# Patient Record
Sex: Male | Born: 1960 | Race: White | Hispanic: No | Marital: Married | Smoking: Never smoker
Health system: Southern US, Community
[De-identification: ages and names within clinical notes are randomized; demographics above are authoritative.]

## PROBLEM LIST (undated history)

## (undated) DIAGNOSIS — E119 Type 2 diabetes mellitus without complications: Secondary | ICD-10-CM

## (undated) HISTORY — PX: CERVICAL SPINE SURGERY: SHX589

---

## 2011-05-19 ENCOUNTER — Ambulatory Visit: Payer: Self-pay | Admitting: Family Medicine

## 2011-06-05 ENCOUNTER — Ambulatory Visit: Payer: Self-pay | Admitting: Family Medicine

## 2011-07-05 ENCOUNTER — Ambulatory Visit: Payer: Self-pay | Admitting: Family Medicine

## 2014-08-07 ENCOUNTER — Emergency Department
Admission: EM | Admit: 2014-08-07 | Discharge: 2014-08-08 | Disposition: A | Discharge status: Home / Self Care | Payer: BLUE CROSS/BLUE SHIELD | Attending: Emergency Medicine | Admitting: Emergency Medicine

## 2014-08-07 ENCOUNTER — Emergency Department: Payer: BLUE CROSS/BLUE SHIELD

## 2014-08-07 ENCOUNTER — Other Ambulatory Visit: Payer: Self-pay

## 2014-08-07 ENCOUNTER — Encounter: Payer: Self-pay | Admitting: Emergency Medicine

## 2014-08-07 DIAGNOSIS — E119 Type 2 diabetes mellitus without complications: Secondary | ICD-10-CM | POA: Insufficient documentation

## 2014-08-07 DIAGNOSIS — G8918 Other acute postprocedural pain: Secondary | ICD-10-CM | POA: Diagnosis not present

## 2014-08-07 DIAGNOSIS — R509 Fever, unspecified: Secondary | ICD-10-CM | POA: Diagnosis not present

## 2014-08-07 DIAGNOSIS — M542 Cervicalgia: Secondary | ICD-10-CM | POA: Diagnosis not present

## 2014-08-07 DIAGNOSIS — M25512 Pain in left shoulder: Secondary | ICD-10-CM

## 2014-08-07 DIAGNOSIS — Z79899 Other long term (current) drug therapy: Secondary | ICD-10-CM | POA: Diagnosis not present

## 2014-08-07 HISTORY — DX: Type 2 diabetes mellitus without complications: E11.9

## 2014-08-07 LAB — CBC WITH DIFFERENTIAL/PLATELET
BASOS PCT: 0 %
Basophils Absolute: 0 10*3/uL (ref 0–0.1)
Eosinophils Absolute: 0.1 10*3/uL (ref 0–0.7)
Eosinophils Relative: 1 %
HEMATOCRIT: 39.1 % — AB (ref 40.0–52.0)
Hemoglobin: 12.6 g/dL — ABNORMAL LOW (ref 13.0–18.0)
LYMPHS ABS: 3.2 10*3/uL (ref 1.0–3.6)
Lymphocytes Relative: 28 %
MCH: 27.3 pg (ref 26.0–34.0)
MCHC: 32.3 g/dL (ref 32.0–36.0)
MCV: 84.7 fL (ref 80.0–100.0)
MONO ABS: 1 10*3/uL (ref 0.2–1.0)
Monocytes Relative: 9 %
NEUTROS PCT: 62 %
Neutro Abs: 6.9 10*3/uL — ABNORMAL HIGH (ref 1.4–6.5)
PLATELETS: 200 10*3/uL (ref 150–440)
RBC: 4.61 MIL/uL (ref 4.40–5.90)
RDW: 14.1 % (ref 11.5–14.5)
WBC: 11.2 10*3/uL — ABNORMAL HIGH (ref 3.8–10.6)

## 2014-08-07 LAB — COMPREHENSIVE METABOLIC PANEL
ALT: 15 U/L — ABNORMAL LOW (ref 17–63)
AST: 19 U/L (ref 15–41)
Albumin: 3.8 g/dL (ref 3.5–5.0)
Alkaline Phosphatase: 59 U/L (ref 38–126)
Anion gap: 8 (ref 5–15)
BUN: 19 mg/dL (ref 6–20)
CO2: 23 mmol/L (ref 22–32)
Calcium: 9.3 mg/dL (ref 8.9–10.3)
Chloride: 105 mmol/L (ref 101–111)
Creatinine, Ser: 1.03 mg/dL (ref 0.61–1.24)
GFR calc Af Amer: >60 mL/min (ref >60)
Glucose, Bld: 192 mg/dL — ABNORMAL HIGH (ref 65–99)
Potassium: 4.4 mmol/L (ref 3.5–5.1)
Sodium: 136 mmol/L (ref 135–145)
Total Bilirubin: 0.7 mg/dL (ref 0.3–1.2)
Total Protein: 7.1 g/dL (ref 6.5–8.1)

## 2014-08-07 MED ORDER — IOHEXOL 300 MG/ML  SOLN
75.0000 mL | Freq: Once | INTRAMUSCULAR | Status: AC | PRN
Start: 2014-08-07 — End: 2014-08-07
  Administered 2014-08-07: 75 mL via INTRAVENOUS

## 2014-08-07 NOTE — ED Notes (Signed)
Patient had surgery c6 and c7 fusion Wednesday afternoon. Wednesday night patient had to be taken back to emergency surgery for a hematoma. Tonight patient developed a shooting pain to left shoulder and arm. Patient started running a fever of 101 at home.

## 2014-08-08 ENCOUNTER — Emergency Department: Payer: BLUE CROSS/BLUE SHIELD

## 2014-08-08 MED ORDER — HYDROMORPHONE HCL 1 MG/ML IJ SOLN
1.0000 mg | Freq: Once | INTRAMUSCULAR | Status: AC
  Administered 2014-08-08: 1 mg via INTRAVENOUS

## 2014-08-08 MED ORDER — SENNA 8.6 MG PO TABS: 2.0000 | ORAL_TABLET | Freq: Two times a day (BID) | ORAL | Status: AC

## 2014-08-08 MED ORDER — HYDROMORPHONE HCL 1 MG/ML IJ SOLN
INTRAMUSCULAR | Status: AC
  Filled 2014-08-08: qty 1

## 2014-08-08 MED ORDER — SODIUM CHLORIDE 0.9 % IV BOLUS (SEPSIS)
1000.0000 mL | Freq: Once | INTRAVENOUS | Status: AC
  Administered 2014-08-08: 1000 mL via INTRAVENOUS

## 2014-08-08 MED ORDER — DOCUSATE SODIUM 100 MG PO CAPS: 200.0000 mg | ORAL_CAPSULE | Freq: Two times a day (BID) | ORAL | Status: AC

## 2014-08-08 NOTE — ED Provider Notes (Addendum)
[facility] Emergency Department Provider Note  ____________________________________________  Time seen: 11:05 PM  I have reviewed the triage vital signs and the nursing notes.   HISTORY  Chief Complaint Post-op Problem    HPI Darin James is a 54 y.o. male who complains of pain to the lower neck as well as an episode of severe shooting pain in the left arm associated with left arm weakness earlier today. He has that his strength now feels like it's returned and the pain has improved although he does have severe pain with movement particularly abduction of the left shoulder. 2 days ago he had a C6-C7 fusion which was complicated by hematoma or pseudoaneurysm formation that required emergent reoperation the same day. Today at home he had a fever of 101. No vomiting or diarrhea, no shortness of breath or cough, no dysuria or frequency or urgency.No bowel or bladder retention or incontinence. Normal gait. No distal paresthesia other than the left arm     Past Medical History  Diagnosis Date  . Diabetes mellitus without complication     There are no active problems to display for this patient.   No past surgical history on file. Cervical fusion 2 days ago Current Outpatient Rx  Name  Route  Sig  Dispense  Refill  . docusate sodium (COLACE) 100 MG capsule   Oral   Take 2 capsules (200 mg total) by mouth 2 (two) times daily.   120 capsule   0   . senna (SENOKOT) 8.6 MG TABS tablet   Oral   Take 2 tablets (17.2 mg total) by mouth 2 (two) times daily.   120 each   0    OxyContin Allergies Review of patient's allergies indicates no known allergies.  History reviewed. No pertinent family history.  Social History History  Substance Use Topics  . Smoking status: Never Smoker   . Smokeless tobacco: Not on file  . Alcohol Use: No     Comment: occasionaly    Review of Systems  Constitutional: Fever and chills. No weight changes Eyes:No  blurry vision or double vision.  ENT: No sore throat. Cardiovascular: No chest pain. Respiratory: No dyspnea or cough. Gastrointestinal: Negative for abdominal pain, vomiting and diarrhea.  No BRBPR or melena. Genitourinary: Negative for dysuria, urinary retention, bloody urine, or difficulty urinating. Musculoskeletal: Left arm pain and neck pain.. Skin: Negative for rash. Neurological: Negative for headaches, focal weakness or numbness. Psychiatric:No anxiety or depression.   Endocrine:No hot/cold intolerance, changes in energy, or sleep difficulty.  10-point ROS otherwise negative.  ____________________________________________   PHYSICAL EXAM:  VITAL SIGNS: ED Triage Vitals  Enc Vitals Group     BP 08/07/14 2201 154/97 mmHg     Pulse Rate 08/07/14 2300 85     Resp 08/07/14 2201 18     Temp 08/07/14 2201 99.3 F (37.4 C)     Temp Source 08/07/14 2201 Oral     SpO2 08/07/14 2201 93 %     Weight 08/07/14 2201 235 lb (106.595 kg)     Height 08/07/14 2201  (1.854 m)     Head Cir --      Peak Flow --      Pain Score 08/07/14 2202 8     Pain Loc --      Pain Edu? --      Excl. in GC? --      Constitutional: Alert and oriented. Well appearing and in no distress. Eyes: No scleral icterus.  No conjunctival pallor. PERRL. EOMI ENT   Head: Normocephalic and atraumatic.   Nose: No congestion/rhinnorhea. No septal hematoma   Mouth/Throat: MMM, no pharyngeal erythema. No peritonsillar mass. No uvula shift.   Neck: No stridor. No SubQ emphysema. No meningismus. Anterior approach incisions healing well without drainage induration swelling or significant erythema. Nontender. No pain with axial loading. No midline cervical or spinal tenderness. Hematological/Lymphatic/Immunilogical: No cervical lymphadenopathy. Cardiovascular: RRR. Normal and symmetric distal pulses are present in all extremities. No murmurs, rubs, or gallops. Respiratory: Normal respiratory effort  without tachypnea nor retractions. Breath sounds are clear and equal bilaterally. No wheezes/rales/rhonchi. Gastrointestinal: Soft and nontender. No distention. There is no CVA tenderness.  No rebound, rigidity, or guarding. Genitourinary: deferred Musculoskeletal: Left arm pain with abduction of the left shoulder. No focal tenderness. No rash or crepitus or evidence of localized inflammation.  Neurologic:   Normal speech and language.  CN 2-10 normal. Motor grossly intact. No pronator drift.  Normal gait. No gross focal neurologic deficits are appreciated.  Skin:  Skin is warm, dry and intact. No rash noted.  No petechiae, purpura, or bullae. Psychiatric: Mood and affect are normal. Speech and behavior are normal. Patient exhibits appropriate insight and judgment.  ____________________________________________    LABS (pertinent positives/negatives) (all labs ordered are listed, but only abnormal results are displayed) Labs Reviewed  CBC WITH DIFFERENTIAL/PLATELET - Abnormal; Notable for the following:    WBC 11.2 (*)    Hemoglobin 12.6 (*)    HCT 39.1 (*)    Neutro Abs 6.9 (*)    All other components within normal limits  COMPREHENSIVE METABOLIC PANEL - Abnormal; Notable for the following:    Glucose, Bld 192 (*)    ALT 15 (*)    All other components within normal limits  URINALYSIS COMPLETEWITH MICROSCOPIC ([facility] ONLY)   ____________________________________________   EKG interpreted by me Normal sinus rhythm rate of 99, normal axis intervals QRS ST segments and T waves.  ____________________________________________    RADIOLOGY  Chest x-ray shows bibasilar atelectasis versus mild infiltrate CT soft tissue neck unremarkable MRI cervical spine unremarkable  ____________________________________________   PROCEDURES  ____________________________________________   INITIAL IMPRESSION / ASSESSMENT AND PLAN / ED COURSE  Pertinent labs & imaging results that were  available during my care of the patient were reviewed by me and considered in my medical decision making (see chart for details).  Patient's overall presentation does not appear to be strongly consistent with spinal epidural abscess or hematoma formation. Due to the lack of other symptoms, his fever may be due to atelectasis, and the left arm pain does sound consistent with a typical post operative spasticity and musculoskeletal irritation. However, he is at elevated risk for spinal complication. We'll start with chest x-ray and CT of the neck soft tissues to look for obvious cause, and if unremarkable we'll proceed with MRI of the C-spine.  ----------------------------------------- 2:08 AM on 08/08/2014 -----------------------------------------  No convincing cause of his symptoms have been found with initial workup. Patient proceeding to MRI at this time. Remains hemodynamically stable, pain controlled, we'll follow-up MRI report ----------------------------------------- 4:00 AM on 08/08/2014 -----------------------------------------  Patient remains hemodynamically stable, calm comfortable. We'll start on senna Colace GI regimen due to his frequent opioid medication use. No evidence of epidural abscess or hematoma or other complication at this time. We'll have him follow-up with his neurosurgeon for continued monitoring of his symptoms. Fever appears to be due to atelectasis which is a expected postoperative issue. No evidence of  pneumonia or symptoms consistent of urinary tract infection or other source of infection or sepsis. ____________________________________________   FINAL CLINICAL IMPRESSION(S) / ED DIAGNOSES  Final diagnoses:  Neck pain  Left shoulder pain      Sharman CheekPhillip Day Greb, MD 08/08/14 0401  Sharman CheekPhillip Magnus Crescenzo, MD 08/08/14 [phone]

## 2014-08-08 NOTE — Discharge Instructions (Signed)

## 2014-08-08 NOTE — ED Notes (Signed)
Pt up for discharge.  Pt left before discharge instructions could be reviewed.  Pt prescribed two prescriptions.  Called pt at [phone] with answer and informed pt that discharge packet and prescriptions will be held at front desk.

## 2016-06-01 DIAGNOSIS — Z794 Long term (current) use of insulin: Secondary | ICD-10-CM | POA: Diagnosis not present

## 2016-06-01 DIAGNOSIS — I10 Essential (primary) hypertension: Secondary | ICD-10-CM | POA: Diagnosis not present

## 2016-06-01 DIAGNOSIS — E119 Type 2 diabetes mellitus without complications: Secondary | ICD-10-CM | POA: Diagnosis not present

## 2016-06-01 DIAGNOSIS — Z23 Encounter for immunization: Secondary | ICD-10-CM | POA: Diagnosis not present

## 2016-06-01 DIAGNOSIS — E1140 Type 2 diabetes mellitus with diabetic neuropathy, unspecified: Secondary | ICD-10-CM | POA: Diagnosis not present

## 2016-06-01 DIAGNOSIS — Z0000 Encounter for general adult medical examination without abnormal findings: Secondary | ICD-10-CM | POA: Diagnosis not present

## 2016-06-06 DIAGNOSIS — E875 Hyperkalemia: Secondary | ICD-10-CM | POA: Diagnosis not present

## 2016-06-17 DIAGNOSIS — J019 Acute sinusitis, unspecified: Secondary | ICD-10-CM | POA: Diagnosis not present

## 2016-09-01 DIAGNOSIS — M5030 Other cervical disc degeneration, unspecified cervical region: Secondary | ICD-10-CM | POA: Diagnosis not present

## 2016-09-01 DIAGNOSIS — E1121 Type 2 diabetes mellitus with diabetic nephropathy: Secondary | ICD-10-CM | POA: Diagnosis not present

## 2016-09-01 DIAGNOSIS — M4692 Unspecified inflammatory spondylopathy, cervical region: Secondary | ICD-10-CM | POA: Diagnosis not present

## 2016-09-01 DIAGNOSIS — E119 Type 2 diabetes mellitus without complications: Secondary | ICD-10-CM | POA: Diagnosis not present

## 2016-09-04 DIAGNOSIS — R079 Chest pain, unspecified: Secondary | ICD-10-CM | POA: Diagnosis not present

## 2016-09-04 DIAGNOSIS — E1121 Type 2 diabetes mellitus with diabetic nephropathy: Secondary | ICD-10-CM | POA: Diagnosis not present

## 2016-09-04 DIAGNOSIS — M4692 Unspecified inflammatory spondylopathy, cervical region: Secondary | ICD-10-CM | POA: Diagnosis not present

## 2016-09-04 DIAGNOSIS — M5030 Other cervical disc degeneration, unspecified cervical region: Secondary | ICD-10-CM | POA: Diagnosis not present

## 2016-09-14 DIAGNOSIS — R079 Chest pain, unspecified: Secondary | ICD-10-CM | POA: Diagnosis not present

## 2016-09-14 DIAGNOSIS — M5030 Other cervical disc degeneration, unspecified cervical region: Secondary | ICD-10-CM | POA: Diagnosis not present

## 2016-09-14 DIAGNOSIS — E1121 Type 2 diabetes mellitus with diabetic nephropathy: Secondary | ICD-10-CM | POA: Diagnosis not present

## 2016-09-14 DIAGNOSIS — M4692 Unspecified inflammatory spondylopathy, cervical region: Secondary | ICD-10-CM | POA: Diagnosis not present

## 2016-12-18 DIAGNOSIS — Z2821 Immunization not carried out because of patient refusal: Secondary | ICD-10-CM | POA: Diagnosis not present

## 2016-12-18 DIAGNOSIS — I10 Essential (primary) hypertension: Secondary | ICD-10-CM | POA: Diagnosis not present

## 2016-12-18 DIAGNOSIS — M5030 Other cervical disc degeneration, unspecified cervical region: Secondary | ICD-10-CM | POA: Diagnosis not present

## 2016-12-18 DIAGNOSIS — E119 Type 2 diabetes mellitus without complications: Secondary | ICD-10-CM | POA: Diagnosis not present

## 2016-12-24 ENCOUNTER — Emergency Department: Payer: BLUE CROSS/BLUE SHIELD

## 2016-12-24 ENCOUNTER — Emergency Department
Admission: EM | Admit: 2016-12-24 | Discharge: 2016-12-24 | Disposition: A | Discharge status: Home / Self Care | Payer: BLUE CROSS/BLUE SHIELD | Attending: Emergency Medicine | Admitting: Emergency Medicine

## 2016-12-24 ENCOUNTER — Encounter: Payer: Self-pay | Admitting: Emergency Medicine

## 2016-12-24 DIAGNOSIS — N23 Unspecified renal colic: Secondary | ICD-10-CM | POA: Diagnosis not present

## 2016-12-24 DIAGNOSIS — R109 Unspecified abdominal pain: Secondary | ICD-10-CM | POA: Diagnosis not present

## 2016-12-24 DIAGNOSIS — R1031 Right lower quadrant pain: Secondary | ICD-10-CM | POA: Diagnosis present

## 2016-12-24 DIAGNOSIS — E11649 Type 2 diabetes mellitus with hypoglycemia without coma: Secondary | ICD-10-CM | POA: Insufficient documentation

## 2016-12-24 DIAGNOSIS — N50811 Right testicular pain: Secondary | ICD-10-CM

## 2016-12-24 DIAGNOSIS — N433 Hydrocele, unspecified: Secondary | ICD-10-CM | POA: Diagnosis not present

## 2016-12-24 DIAGNOSIS — N281 Cyst of kidney, acquired: Secondary | ICD-10-CM | POA: Diagnosis not present

## 2016-12-24 DIAGNOSIS — N200 Calculus of kidney: Secondary | ICD-10-CM | POA: Diagnosis not present

## 2016-12-24 DIAGNOSIS — E162 Hypoglycemia, unspecified: Secondary | ICD-10-CM

## 2016-12-24 LAB — URINALYSIS, COMPLETE (UACMP) WITH MICROSCOPIC
Bacteria, UA: NONE SEEN
Bilirubin Urine: NEGATIVE
Glucose, UA: NEGATIVE mg/dL
Hgb urine dipstick: NEGATIVE
Ketones, ur: NEGATIVE mg/dL
Leukocytes, UA: NEGATIVE
Nitrite: NEGATIVE
PROTEIN: NEGATIVE mg/dL
SPECIFIC GRAVITY, URINE: 1.019 (ref 1.005–1.030)
Squamous Epithelial / LPF: NONE SEEN
pH: 5 (ref 5.0–8.0)

## 2016-12-24 LAB — BASIC METABOLIC PANEL
Anion gap: 8 (ref 5–15)
BUN: 16 mg/dL (ref 6–20)
CALCIUM: 9.5 mg/dL (ref 8.9–10.3)
CHLORIDE: 107 mmol/L (ref 101–111)
CO2: 23 mmol/L (ref 22–32)
Creatinine, Ser: 1.06 mg/dL (ref 0.61–1.24)
GFR calc non Af Amer: >60 mL/min (ref >60)
Glucose, Bld: 64 mg/dL — ABNORMAL LOW (ref 65–99)
POTASSIUM: 4.6 mmol/L (ref 3.5–5.1)
Sodium: 138 mmol/L (ref 135–145)

## 2016-12-24 LAB — CBC
HEMATOCRIT: 44 % (ref 40.0–52.0)
HEMOGLOBIN: 14.2 g/dL (ref 13.0–18.0)
MCH: 28.2 pg (ref 26.0–34.0)
MCHC: 32.4 g/dL (ref 32.0–36.0)
MCV: 87 fL (ref 80.0–100.0)
Platelets: 202 10*3/uL (ref 150–440)
RBC: 5.05 MIL/uL (ref 4.40–5.90)
RDW: 15.1 % — AB (ref 11.5–14.5)
WBC: 7.9 10*3/uL (ref 3.8–10.6)

## 2016-12-24 MED ORDER — KETOROLAC TROMETHAMINE 60 MG/2ML IM SOLN
60.0000 mg | Freq: Once | INTRAMUSCULAR | Status: AC
  Administered 2016-12-24: 60 mg via INTRAMUSCULAR

## 2016-12-24 MED ORDER — ONDANSETRON 4 MG PO TBDP: 4.0000 mg | ORAL_TABLET | Freq: Three times a day (TID) | ORAL | 0 refills | Status: AC | PRN

## 2016-12-24 MED ORDER — KETOROLAC TROMETHAMINE 60 MG/2ML IM SOLN
INTRAMUSCULAR | Status: AC
  Administered 2016-12-24: 60 mg via INTRAMUSCULAR
  Filled 2016-12-24: qty 2

## 2016-12-24 MED ORDER — KETOROLAC TROMETHAMINE 10 MG PO TABS: 10.0000 mg | ORAL_TABLET | Freq: Three times a day (TID) | ORAL | 0 refills | Status: AC | PRN

## 2016-12-24 NOTE — ED Provider Notes (Signed)
[facility] Emergency Department Provider Note  ____________________________________________  Time seen: Approximately 4:15 PM  I have reviewed the triage vital signs and the nursing notes.   HISTORY  Chief Complaint Flank Pain    HPI Darin James is a 56 y.o. male with a history of renal colic, DM, presenting with lower right back pain radiating to the right groin crease and into the right testiclefor the past 3 days. The patient denies any dysuria, hematuria, urinary frequency. He has not had any fever or chills, nausea vomiting or diarrhea. Trauma or strain. No history of back pain.   Past Medical History:  Diagnosis Date  . Diabetes mellitus without complication (HCC)     There are no active problems to display for this patient.   Past Surgical History:  Procedure Laterality Date  . CERVICAL SPINE SURGERY      Current Outpatient Rx  . Order #: [id number] Class: Print  . Order #: [id number] Class: Print  . Order #: [id number] Class: Print  . Order #: [id number] Class: Print    Allergies Patient has no known allergies.  No family history on file.  Social History Social History  Substance Use Topics  . Smoking status: Never Smoker  . Smokeless tobacco: Never Used  . Alcohol use No     Comment: occasionaly    Review of Systems Constitutional: No fever/chills. No lightheadedness or syncope. Eyes: No visual changes. ENT: No sore throat. No congestion or rhinorrhea. Cardiovascular: Denies chest pain. Denies palpitations. Respiratory: Denies shortness of breath.  No cough. Gastrointestinal: No abdominal pain.  No nausea, no vomiting.  No diarrhea.  No constipation. Genitourinary: Negative for dysuria. No hematuria. No urinary frequency. No penile discharge. Positive right testicular pain. Musculoskeletal: Positive for pain over the right buttock, radiating to the right inguinal crease. Skin: Negative for rash. Neurological: Negative for  headaches. No focal numbness, tingling or weakness.     ____________________________________________   PHYSICAL EXAM:  VITAL SIGNS: ED Triage Vitals  Enc Vitals Group     BP 12/24/16 1357 (!) 145/101     Pulse Rate 12/24/16 1357 64     Resp 12/24/16 1357 16     Temp 12/24/16 1357 97.6 F (36.4 C)     Temp Source 12/24/16 1357 Oral     SpO2 12/24/16 1357 99 %     Weight 12/24/16 1357 240 lb (108.9 kg)     Height 12/24/16 1357 6\' 1"  (1.854 m)     Head Circumference --      Peak Flow --      Pain Score 12/24/16 1356 7     Pain Loc --      Pain Edu? --      Excl. in GC? --     Constitutional: Alert and oriented. Well appearing and in no acute distress. Answers questions appropriately. Eyes: Conjunctivae are normal.  EOMI. No scleral icterus. Head: Atraumatic. Nose: No congestion/rhinnorhea. Mouth/Throat: Mucous membranes are moist.  Neck: No stridor.  Supple.   Cardiovascular: Normal rate, regular rhythm. No murmurs, rubs or gallops.  Respiratory: Normal respiratory effort.  No accessory muscle use or retractions. Lungs CTAB.  No wheezes, rales or ronchi. Gastrointestinal: Soft, nontender and nondistended.  No guarding or rebound.  No peritoneal signs. Genitourinary: Normal-appearing penis without lesions or discharge. Right testicle has a slightly lower lie than the left. Both testicles are without palpable masses, or tenderness to palpation. There is some right inguinal fullness but no palpable hernia on inguinal exam. The left  inguinal canal is also clear of palpable bowel. Musculoskeletal: No LE edema. No midline thoracic or lumbar spine tenderness to palpation. No reproducible pain over the right buttock. Neurologic:  A&Ox3.  Speech is clear.  Face and smile are symmetric.  EOMI.  Moves all extremities well. Skin:  Skin is warm, dry and intact. No rash noted. Psychiatric: Mood and affect are normal. Speech and behavior are normal.  Normal  judgement.  ____________________________________________   LABS (all labs ordered are listed, but only abnormal results are displayed)  Labs Reviewed  URINALYSIS, COMPLETE (UACMP) WITH MICROSCOPIC - Abnormal; Notable for the following:       Result Value   Color, Urine YELLOW (*)    APPearance CLEAR (*)    All other components within normal limits  CBC - Abnormal; Notable for the following:    RDW 15.1 (*)    All other components within normal limits  BASIC METABOLIC PANEL - Abnormal; Notable for the following:    Glucose, Bld 64 (*)    All other components within normal limits   ____________________________________________  EKG  Not indicated ____________________________________________  RADIOLOGY  Ct Renal Stone Study  Result Date: 12/24/2016 CLINICAL DATA:  Right flank pain since Friday radiating to the testicles. EXAM: CT ABDOMEN AND PELVIS WITHOUT CONTRAST TECHNIQUE: Multidetector CT imaging of the abdomen and pelvis was performed following the standard protocol without IV contrast. COMPARISON:  None. FINDINGS: Lower chest: No acute abnormality. Normal heart size without pericardial effusion. No acute pulmonary abnormality. Hepatobiliary: Uncomplicated cholelithiasis. No space-occupying mass of the liver given limitations of a noncontrast study. No biliary dilatation is noted. There is no choledocholithiasis. Pancreas: Unremarkable. No pancreatic ductal dilatation or surrounding inflammatory changes. Spleen: Normal in size without focal abnormality. Adrenals/Urinary Tract: Normal bilateral adrenal glands. No nephrolithiasis obstructive uropathy. Small extrarenal pelvis on the left. There is a 2 cm interpolar right renal simple cyst with small 1 cm exophytic left lower pole renal cyst too small to further characterize. Stomach/Bowel: Nondistended stomach. Normal small bowel rotation without obstruction. The appendix is not confidently identified but no pericecal inflammation to  suggest acute appendicitis. A moderate amount of fecal retention noted. No bowel obstruction is seen. Vascular/Lymphatic: Small retroperitoneal para-aortic lymph nodes without pathologic enlargement, the largest approximately 12 mm. No lymphadenopathy. Minimal aortic atherosclerosis without aneurysm. Reproductive: Normal prostate and seminal vesicles. Other: Faint fatty induration adjacent to the left ureter without obstruction, overlying the left iliac vessels. Findings could be related to a urinary tract infection or recently passed stone. Musculoskeletal: No acute nor suspicious osseous abnormality. Lumbosacral transitional vertebral anatomy with short twelfth ribs. The lowest square vertebral body on the lateral view to be labeled L5. IMPRESSION: 1. Mild left-sided periuterine enteric fatty induration without obstruction. Findings are nonspecific but may be secondary to urinary tract infection or recently passed stone. 2. No nephrolithiasis. Bilateral renal cysts, the largest measuring 2 cm in the interpolar right kidney and a 1 cm exophytic cyst on the left. 3. Uncomplicated cholelithiasis. 4. Several nonspecific small retroperitoneal, para-aortic lymph nodes possibly representing reactive lymph nodes or potentially a lymphoproliferative disorder. The largest measures 12 mm. Electronically Signed   By: Tollie Eth M.D.   On: 12/24/2016 18:02   [place] Scrotom W/doppler  Result Date: 12/24/2016 CLINICAL DATA:  RIGHT testicle pain for 2 days. History of diabetes. EXAM: SCROTAL ULTRASOUND DOPPLER ULTRASOUND OF THE TESTICLES TECHNIQUE: Complete ultrasound examination of the testicles, epididymis, and other scrotal structures was performed. Color and spectral Doppler ultrasound were  also utilized to evaluate blood flow to the testicles. COMPARISON:  None. FINDINGS: Right testicle Measurements: 4.5 x 2.4 x 3.1 cm. No mass or microlithiasis visualized. Left testicle Measurements: 4.2 x 2.2 x 3.1 cm. No mass or  microlithiasis visualized. Right epididymis:  Normal in size and appearance. Left epididymis:  Normal in size and appearance. Hydrocele:  Small bilateral hydroceles. Varicocele:  None visualized. Pulsed Doppler interrogation of both testes demonstrates normal low resistance arterial and venous waveforms bilaterally. IMPRESSION: Small bilateral hydroceles, otherwise unremarkable scrotal ultrasound. Electronically Signed   By: Awilda Metro M.D.   On: 12/24/2016 17:30    ____________________________________________   PROCEDURES  Procedure(s) performed: None  Procedures  Critical Care performed: No ____________________________________________   INITIAL IMPRESSION / ASSESSMENT AND PLAN / ED COURSE  Pertinent labs & imaging results that were available during my care of the patient were reviewed by me and considered in my medical decision making (see chart for details).  56 y.o. male with a history of renal colic presenting with pain in the lower right back radiating to the right inguinal crease and right testicle. Overall, the patient is mildly hypertensive but afebrile. His abdominal exam is reassuring and without any evidence of acute intra-abdominal pathology. The patient's back pain is significantly lower than his costovertebral angle, but he feels strongly that this pain is similar to prior kidney stone. We'll get a CT scan for further evaluation of the patient has no blood in his urine and the likelihood of renal colic is lower than other possible etiologies. I am also concerned the patient is having testicular pain will get a ultrasound to rule out mass or torsion, although clinically there is no evidence of torsion at this time. I'll treat the patient's pain, and reevaluate him for final disposition.  ----------------------------------------- 6:08 PM on 12/24/2016 -----------------------------------------  Clinically, the patient is doing significantly better. He is able to tolerate  liquids, and has been ambulatory without any pain. His does not show UTI and his kidney function is normal today. His CT scan shows some left-sided perinephric stranding which may be consistent with UTI or recent passage of stone. His urine is not consistent with this so possible he had passed a renal calculus. Interestingly, the patient's symptoms were right-sided, sodas unclear if this finding is clinically significant.  The patient's scrotum shows bilateral hydroceles but no other acute abnormalities. At this time, the patient is safe for discharge. I discussed return precautions as well as follow-up instructions with him.  ____________________________________________  FINAL CLINICAL IMPRESSION(S) / ED DIAGNOSES  Final diagnoses:  Bilateral hydrocele  Hypoglycemia  Bilateral renal cysts  Renal colic on right side         NEW MEDICATIONS STARTED DURING THIS VISIT:  New Prescriptions   KETOROLAC (TORADOL) 10 MG TABLET    Take 1 tablet (10 mg total) by mouth every 8 (eight) hours as needed for moderate pain (with food).   ONDANSETRON (ZOFRAN ODT) 4 MG DISINTEGRATING TABLET    Take 1 tablet (4 mg total) by mouth every 8 (eight) hours as needed for nausea or vomiting.      Rockne Menghini, MD 12/24/16 1810

## 2016-12-24 NOTE — Discharge Instructions (Signed)
Today, your blood sugar was on the low side. Please make sure you eat small regular meals throughout the days that your blood sugar does not run too low.  Please make a follow-up appointment with your primary care physician.  Return to the emergency department if he develops severe pain, fever, nausea or vomiting, or any other symptoms concerning to you.

## 2016-12-24 NOTE — ED Triage Notes (Signed)
Patient presents to the ED with right flank pain since Friday.  Patient states pain radiates into his testicles.  Patient reports history of kidney stones.  States, "that was a long time ago and I don't remember if it felt the same or not."  Patient reports pain has caused patient to have difficulty sleeping.  Patient is sitting comfortably in triage at this time, no obvious distress.  Patient reports difficulty urinating as well.

## 2017-03-19 DIAGNOSIS — E1121 Type 2 diabetes mellitus with diabetic nephropathy: Secondary | ICD-10-CM | POA: Diagnosis not present

## 2017-03-19 DIAGNOSIS — E119 Type 2 diabetes mellitus without complications: Secondary | ICD-10-CM | POA: Diagnosis not present

## 2017-03-19 DIAGNOSIS — M47812 Spondylosis without myelopathy or radiculopathy, cervical region: Secondary | ICD-10-CM | POA: Diagnosis not present

## 2017-03-19 DIAGNOSIS — M5030 Other cervical disc degeneration, unspecified cervical region: Secondary | ICD-10-CM | POA: Diagnosis not present

## 2017-03-20 DIAGNOSIS — E119 Type 2 diabetes mellitus without complications: Secondary | ICD-10-CM | POA: Diagnosis not present

## 2017-05-01 DIAGNOSIS — E119 Type 2 diabetes mellitus without complications: Secondary | ICD-10-CM | POA: Diagnosis not present

## 2017-05-01 DIAGNOSIS — E782 Mixed hyperlipidemia: Secondary | ICD-10-CM | POA: Diagnosis not present

## 2017-05-01 DIAGNOSIS — Z0000 Encounter for general adult medical examination without abnormal findings: Secondary | ICD-10-CM | POA: Diagnosis not present

## 2017-05-01 DIAGNOSIS — Z125 Encounter for screening for malignant neoplasm of prostate: Secondary | ICD-10-CM | POA: Diagnosis not present

## 2017-05-01 DIAGNOSIS — Z794 Long term (current) use of insulin: Secondary | ICD-10-CM | POA: Diagnosis not present

## 2017-05-01 DIAGNOSIS — I10 Essential (primary) hypertension: Secondary | ICD-10-CM | POA: Diagnosis not present

## 2017-05-25 DIAGNOSIS — Z0000 Encounter for general adult medical examination without abnormal findings: Secondary | ICD-10-CM | POA: Diagnosis not present

## 2017-05-31 DIAGNOSIS — H524 Presbyopia: Secondary | ICD-10-CM | POA: Diagnosis not present

## 2017-05-31 DIAGNOSIS — E113393 Type 2 diabetes mellitus with moderate nonproliferative diabetic retinopathy without macular edema, bilateral: Secondary | ICD-10-CM | POA: Diagnosis not present

## 2017-10-16 DIAGNOSIS — E119 Type 2 diabetes mellitus without complications: Secondary | ICD-10-CM | POA: Diagnosis not present

## 2017-10-16 DIAGNOSIS — Z794 Long term (current) use of insulin: Secondary | ICD-10-CM | POA: Diagnosis not present

## 2017-10-16 DIAGNOSIS — I10 Essential (primary) hypertension: Secondary | ICD-10-CM | POA: Diagnosis not present

## 2017-10-16 DIAGNOSIS — E782 Mixed hyperlipidemia: Secondary | ICD-10-CM | POA: Diagnosis not present

## 2018-02-05 DIAGNOSIS — E113393 Type 2 diabetes mellitus with moderate nonproliferative diabetic retinopathy without macular edema, bilateral: Secondary | ICD-10-CM | POA: Diagnosis not present

## 2018-04-22 DIAGNOSIS — E785 Hyperlipidemia, unspecified: Secondary | ICD-10-CM | POA: Diagnosis not present

## 2018-04-22 DIAGNOSIS — E119 Type 2 diabetes mellitus without complications: Secondary | ICD-10-CM | POA: Diagnosis not present

## 2018-04-22 DIAGNOSIS — I10 Essential (primary) hypertension: Secondary | ICD-10-CM | POA: Diagnosis not present

## 2018-04-22 DIAGNOSIS — R350 Frequency of micturition: Secondary | ICD-10-CM | POA: Diagnosis not present

## 2018-04-22 DIAGNOSIS — Z794 Long term (current) use of insulin: Secondary | ICD-10-CM | POA: Diagnosis not present

## 2018-05-06 DIAGNOSIS — E119 Type 2 diabetes mellitus without complications: Secondary | ICD-10-CM | POA: Diagnosis not present

## 2018-05-06 DIAGNOSIS — E782 Mixed hyperlipidemia: Secondary | ICD-10-CM | POA: Diagnosis not present

## 2018-05-06 DIAGNOSIS — Z794 Long term (current) use of insulin: Secondary | ICD-10-CM | POA: Diagnosis not present

## 2018-05-06 DIAGNOSIS — I10 Essential (primary) hypertension: Secondary | ICD-10-CM | POA: Diagnosis not present

## 2018-07-21 IMAGING — CT CT RENAL STONE PROTOCOL
2 of 4 series · 16 of 46 positions shown, 18 images · non-contrast
Comparison: None.

CLINICAL DATA: Right flank pain since [REDACTED] radiating to the
testicles.

EXAM:
CT ABDOMEN AND PELVIS WITHOUT CONTRAST
TECHNIQUE: Multidetector CT imaging of the abdomen and pelvis was performed
following the standard protocol without IV contrast.

[Series 2: stone full standard · axial · 0.87mm/px · z∈[-628,-98]mm · 13 of 116 slices shown, 15 images]
[im 5/116  soft-tissue]
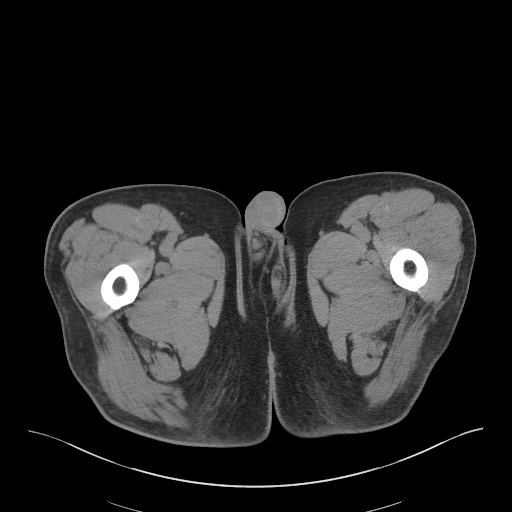
[im 5/116  bone]
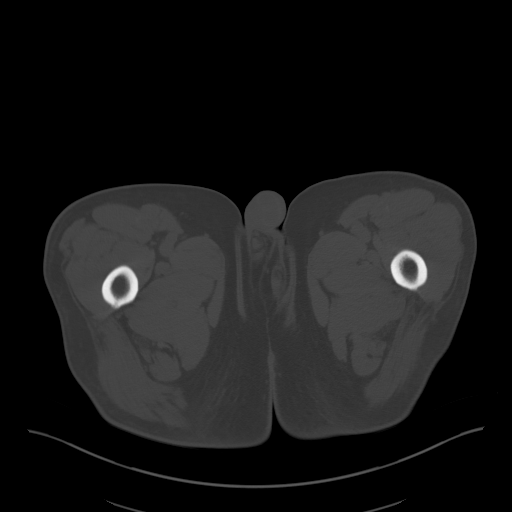
[im 15/116  soft-tissue]
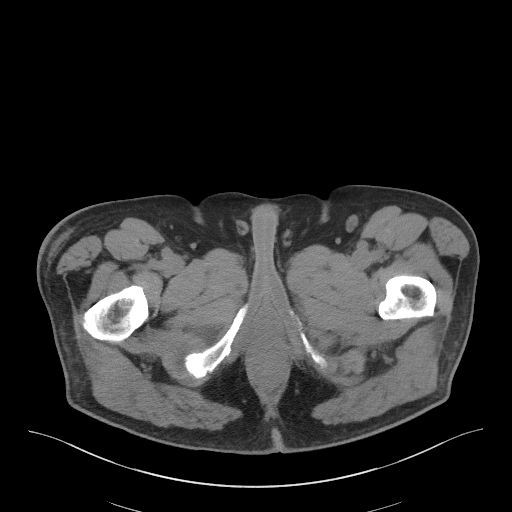
[im 24/116  soft-tissue]
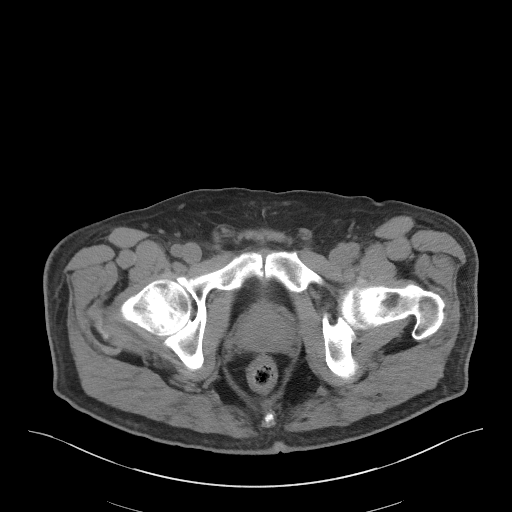
[im 34/116  soft-tissue]
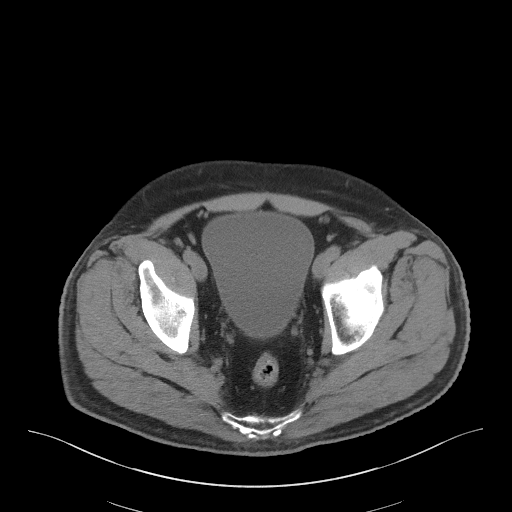
[im 39/116  soft-tissue]
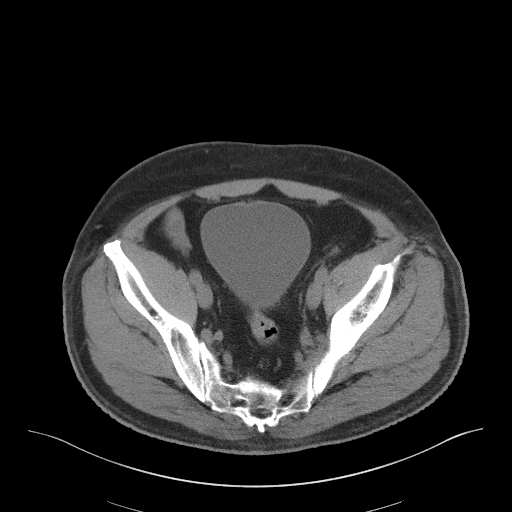
[im 48/116  soft-tissue]
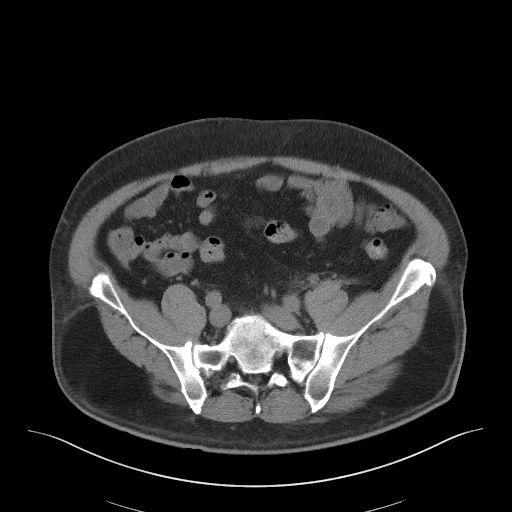
[im 58/116  soft-tissue]
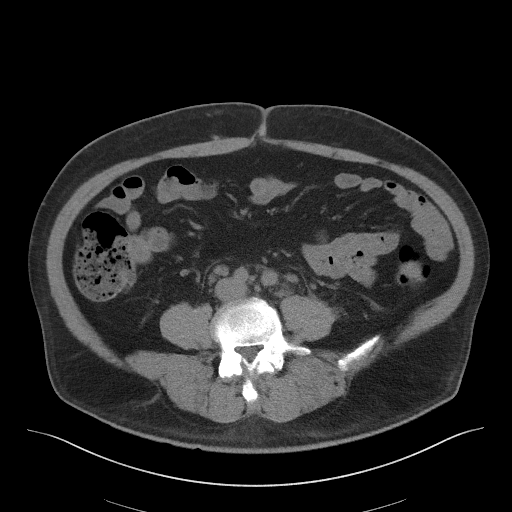
[im 68/116  soft-tissue]
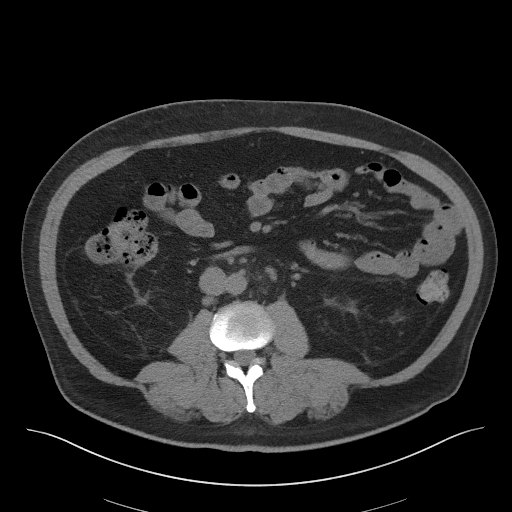
[im 77/116  soft-tissue]
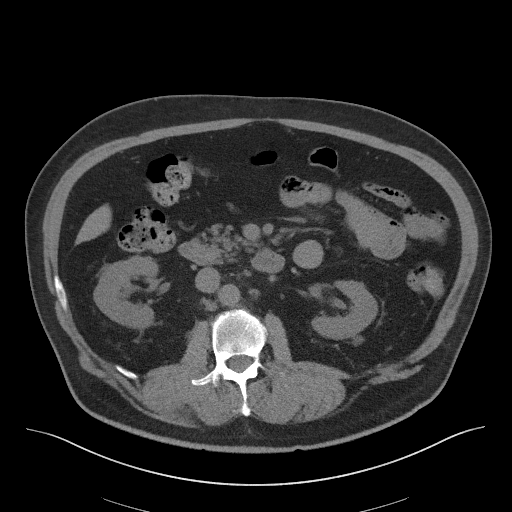
[im 77/116  bone]
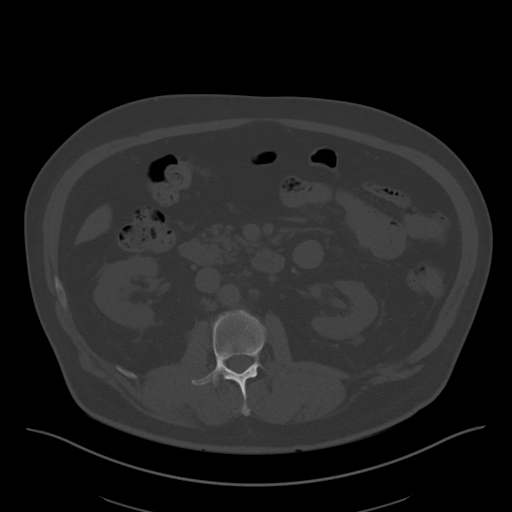
[im 82/116  soft-tissue]
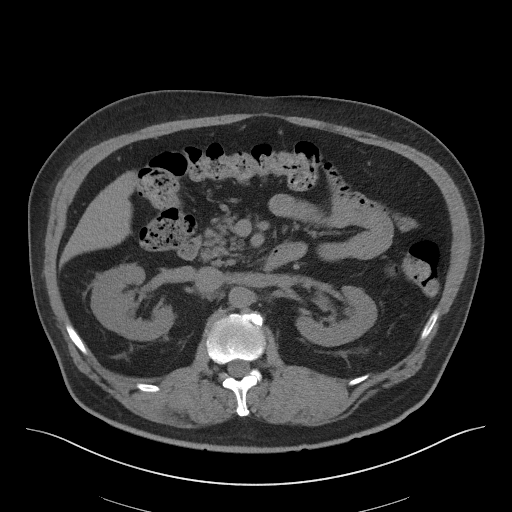
[im 92/116  soft-tissue]
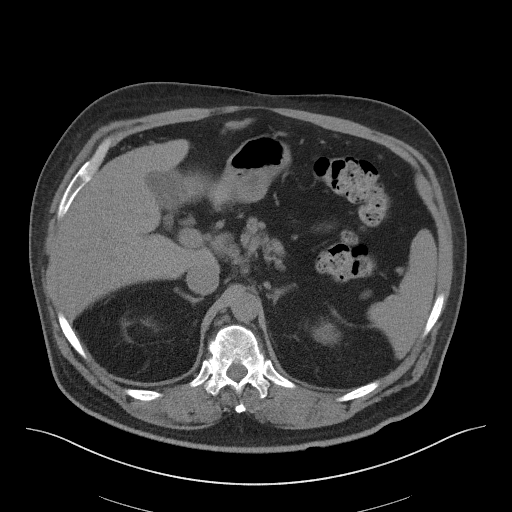
[im 101/116  soft-tissue]
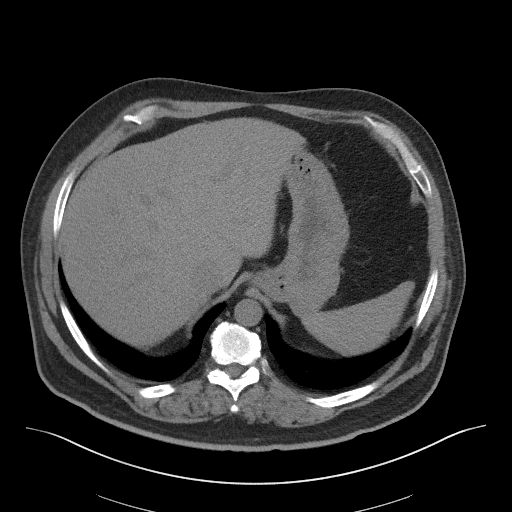
[im 111/116  soft-tissue]
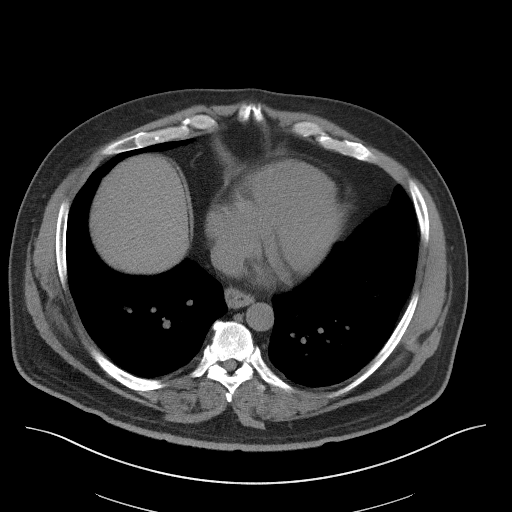

[Series 5: coronal · coronal · 0.97mm/px · 3 of 161 slices shown]
[im 54/161  soft-tissue]
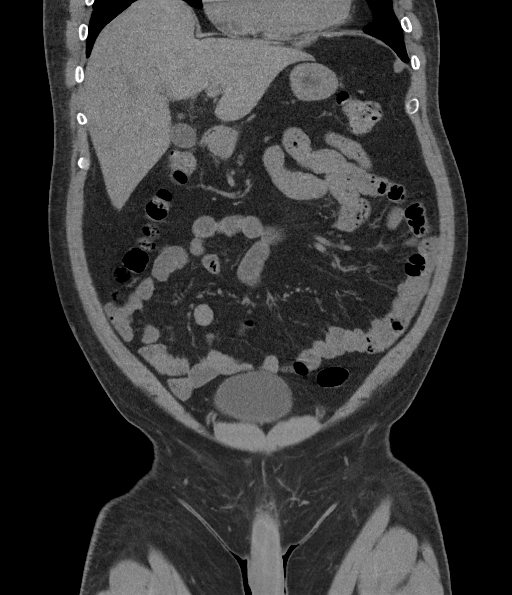
[im 72/161  soft-tissue]
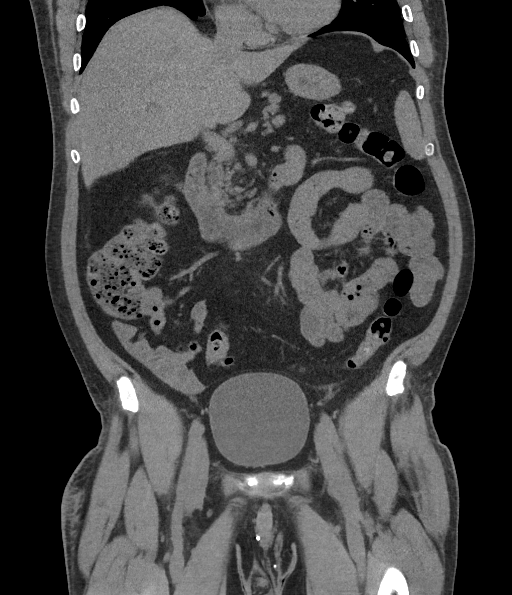
[im 89/161  soft-tissue]
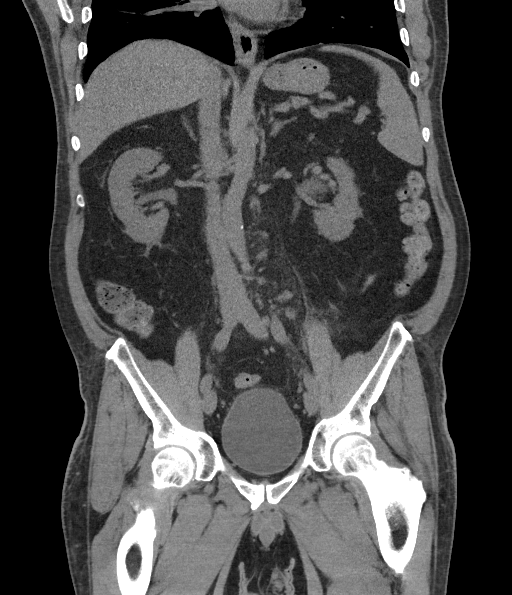

[16 of 46 positions shown; findings below may reference images not displayed]

FINDINGS: Lower chest: No acute abnormality. Normal heart size without
pericardial effusion. No acute pulmonary abnormality.

Hepatobiliary: Uncomplicated cholelithiasis. No space-occupying mass
of the liver given limitations of a noncontrast study. No biliary
dilatation is noted. There is no choledocholithiasis.

Pancreas: Unremarkable. No pancreatic ductal dilatation or
surrounding inflammatory changes.

Spleen: Normal in size without focal abnormality.

Adrenals/Urinary Tract: Normal bilateral adrenal glands. No
nephrolithiasis obstructive uropathy. Small extrarenal pelvis on the
left. There is a 2 cm interpolar right renal simple cyst with small
1 cm exophytic left lower pole renal cyst too small to further
characterize.

Stomach/Bowel: Nondistended stomach. Normal small bowel rotation
without obstruction. The appendix is not confidently identified but
no pericecal inflammation to suggest acute appendicitis. A moderate
amount of fecal retention noted. No bowel obstruction is seen.

Vascular/Lymphatic: Small retroperitoneal para-aortic lymph nodes
without pathologic enlargement, the largest approximately 12 mm. No
lymphadenopathy. Minimal aortic atherosclerosis without aneurysm.

Reproductive: Normal prostate and seminal vesicles.

Other: Faint fatty induration adjacent to the left ureter without
obstruction, overlying the left iliac vessels. Findings could be
related to a urinary tract infection or recently passed stone.

Musculoskeletal: No acute nor suspicious osseous abnormality.
Lumbosacral transitional vertebral anatomy with short twelfth ribs.
The lowest square vertebral body on the lateral view to be labeled
L5.
IMPRESSION: 1. Mild left-sided periuterine enteric fatty induration without
obstruction. Findings are nonspecific but may be secondary to
urinary tract infection or recently passed stone.
2. No nephrolithiasis. Bilateral renal cysts, the largest measuring
2 cm in the interpolar right kidney and a 1 cm exophytic cyst on the
left.
3. Uncomplicated cholelithiasis.
4. Several nonspecific small retroperitoneal, para-aortic lymph
nodes possibly representing reactive lymph nodes or potentially a
lymphoproliferative disorder. The largest measures 12 mm.

## 2019-01-22 IMAGING — US US SCROTUM W/ DOPPLER COMPLETE
1 series · 14 of 25 positions shown · non-contrast
Comparison: None.

CLINICAL DATA: RIGHT testicle pain for 2 days. History of diabetes.

EXAM:
SCROTAL ULTRASOUND
DOPPLER ULTRASOUND OF THE TESTICLES
TECHNIQUE: Complete ultrasound examination of the testicles, epididymis, and
other scrotal structures was performed. Color and spectral Doppler
ultrasound were also utilized to evaluate blood flow to the
testicles.

[Series 1: us scrotum w/ doppler complete · 0.08mm/px · 14 of 69 slices shown]
[im 1/69]
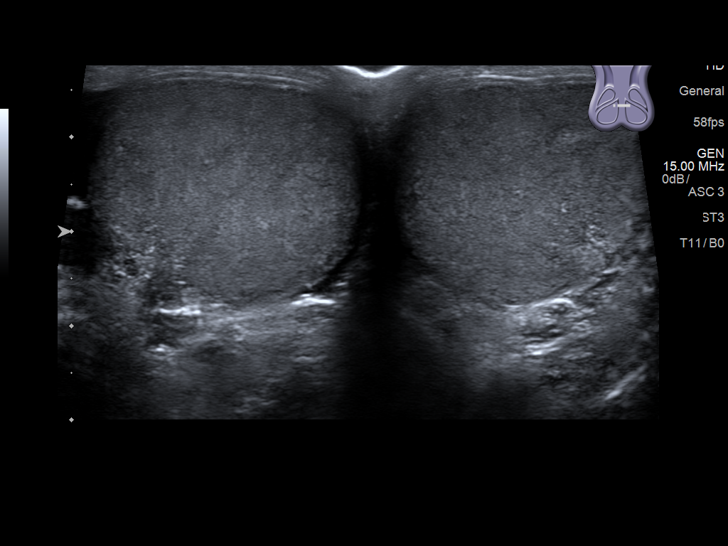
[im 6/69]
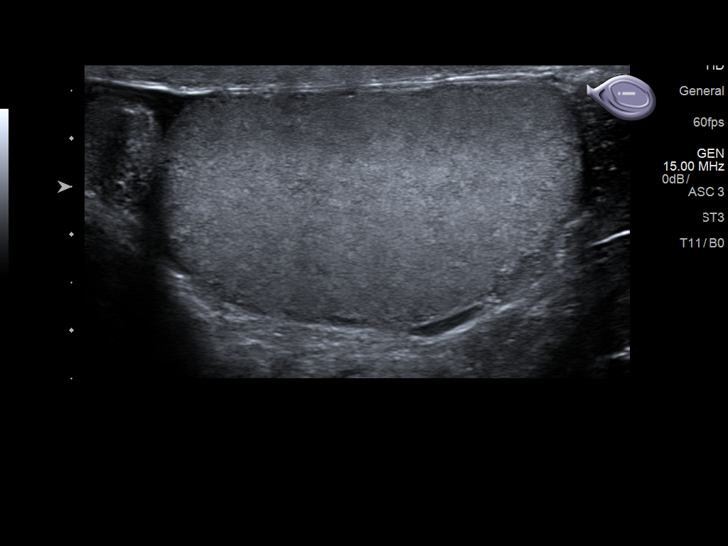
[im 12/69]
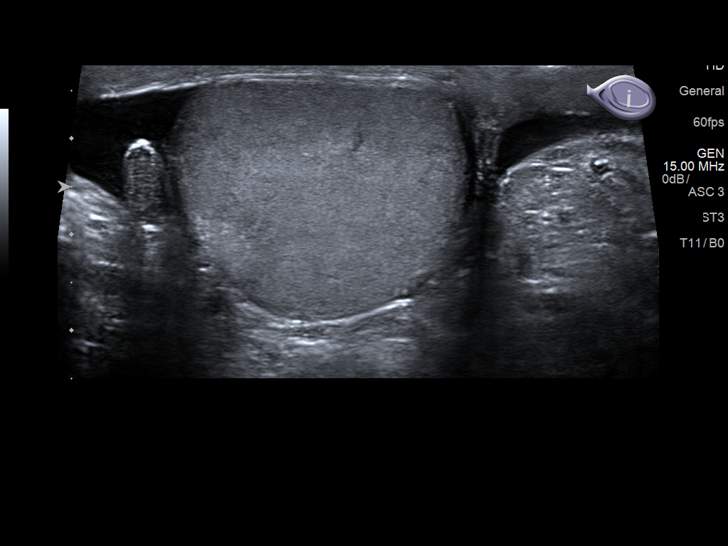
[im 18/69]
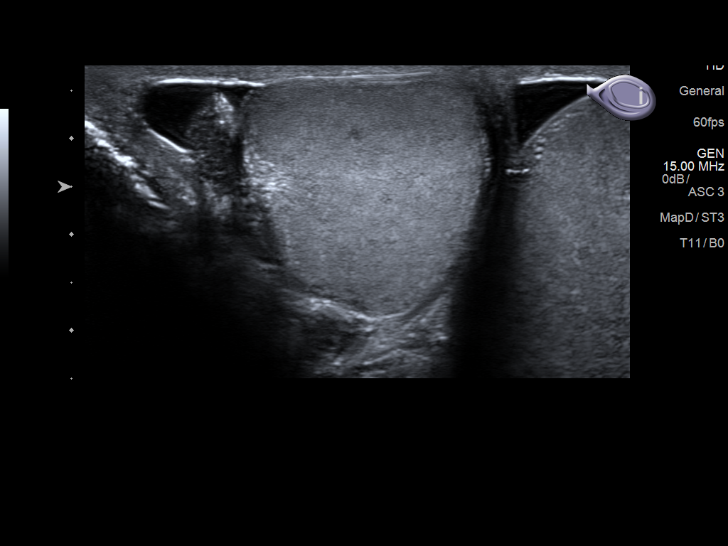
[im 23/69]
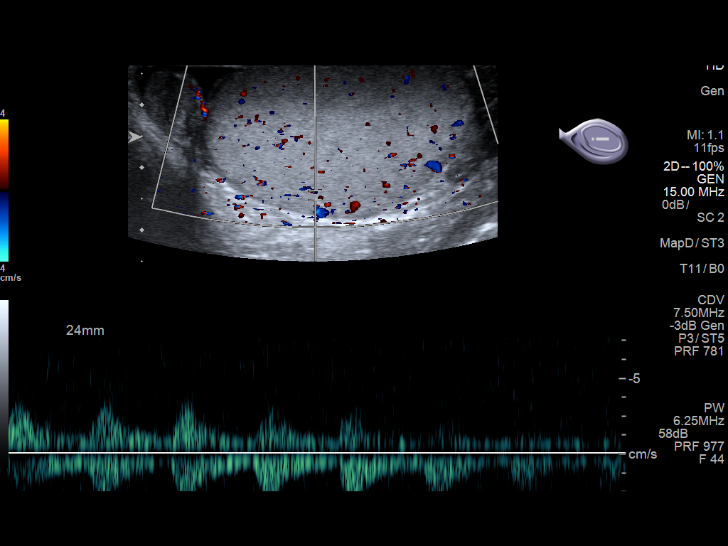
[im 26/69]
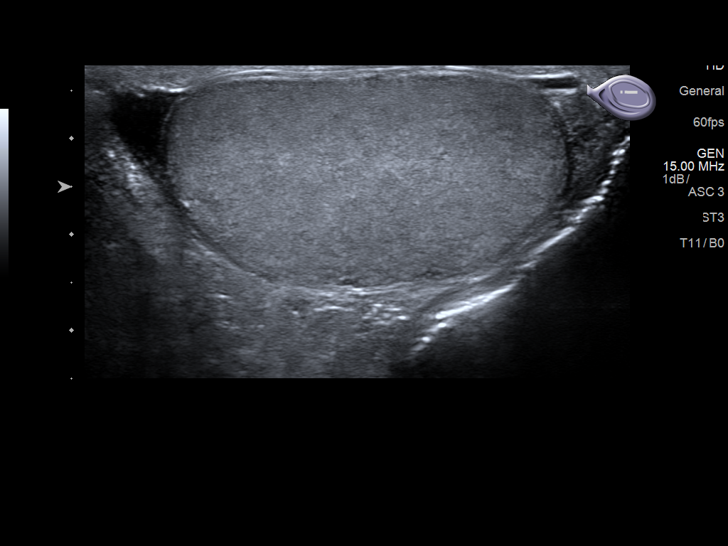
[im 32/69]
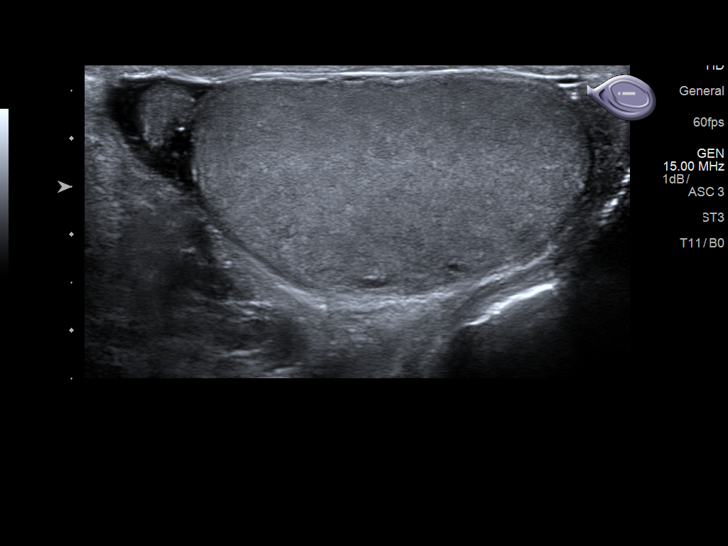
[im 37/69]
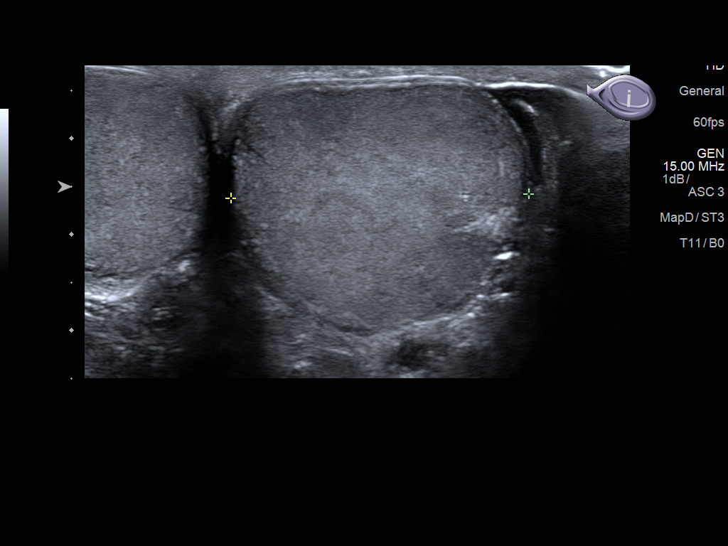
[im 43/69]
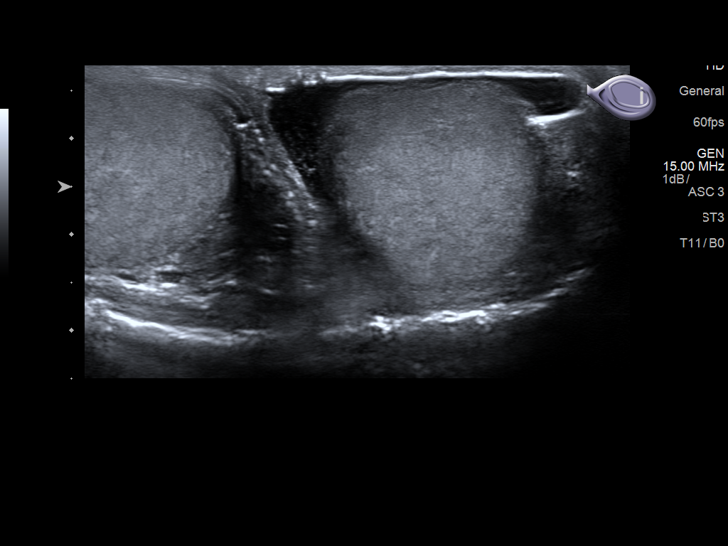
[im 46/69]
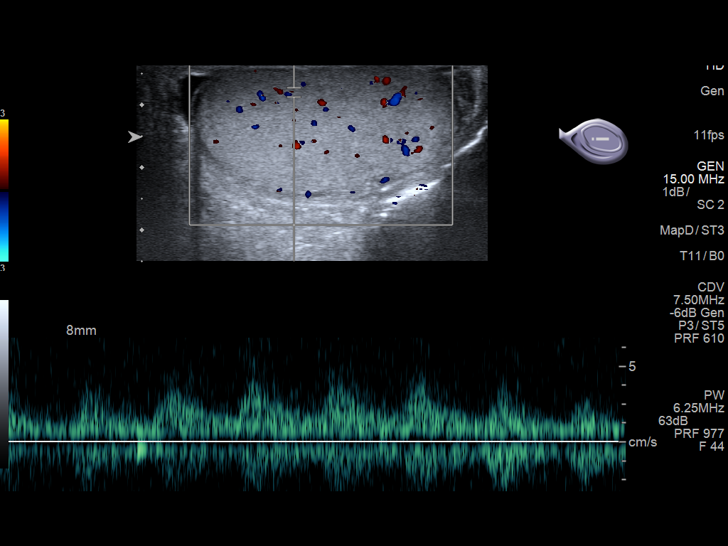
[im 52/69]
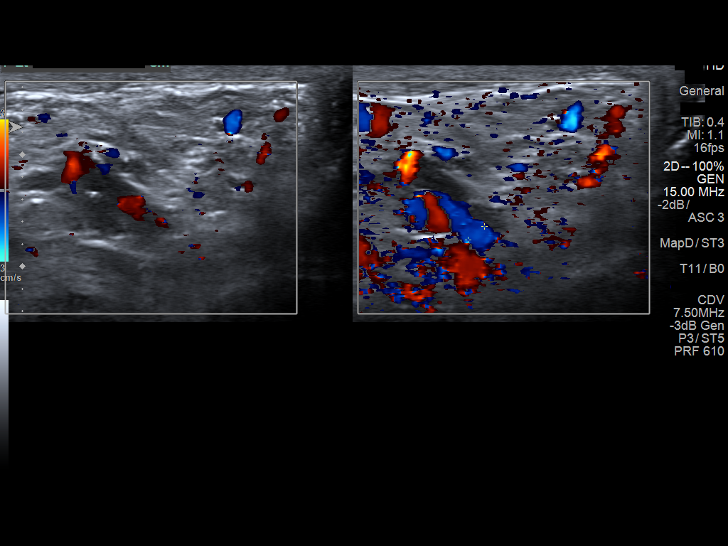
[im 57/69]
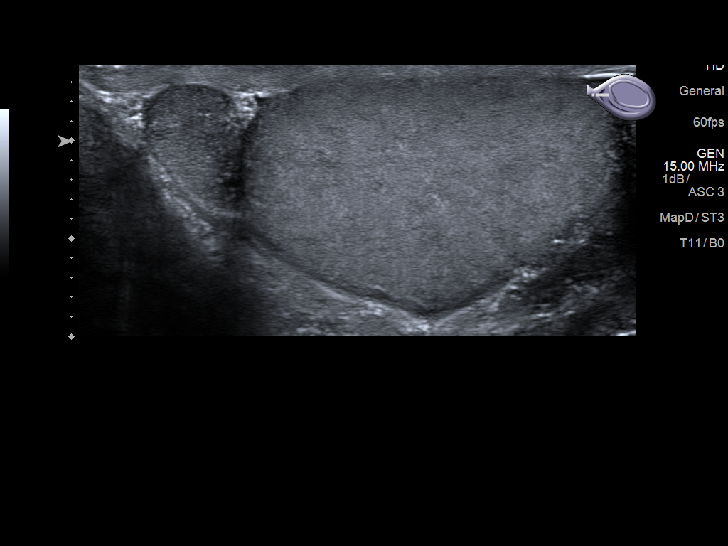
[im 63/69]
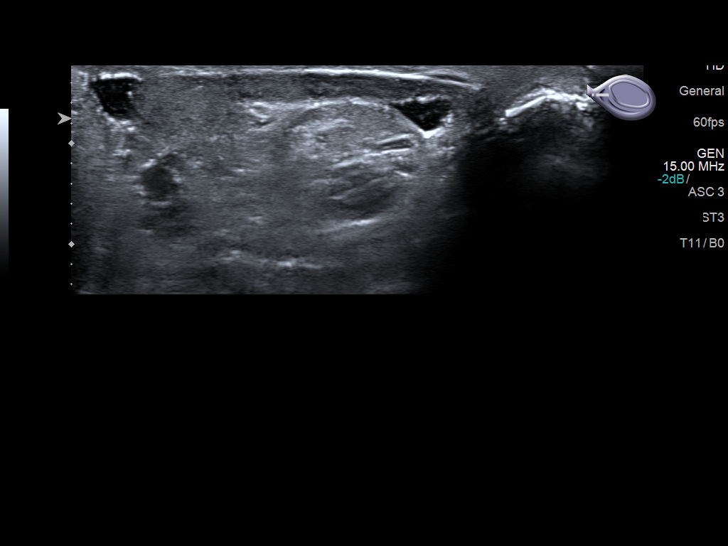
[im 69/69]
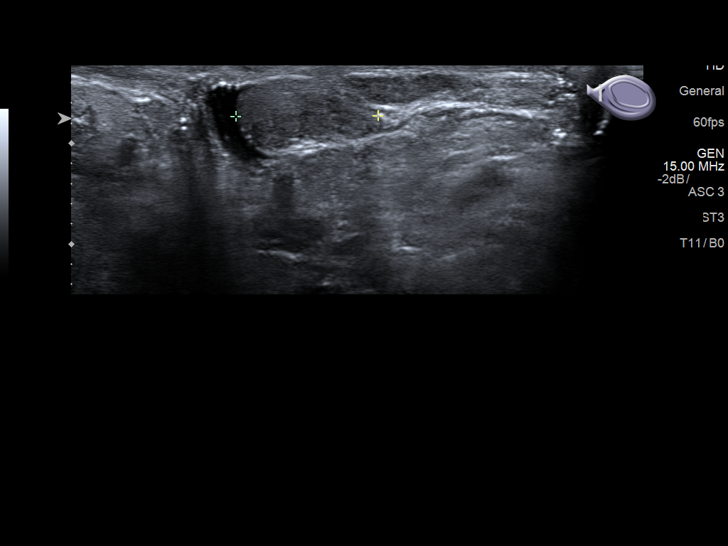

[14 of 25 positions shown; findings below may reference images not displayed]

FINDINGS: Right testicle

Measurements: 4.5 x 2.4 x 3.1 cm. No mass or microlithiasis
visualized.

Left testicle

Measurements: 4.2 x 2.2 x 3.1 cm. No mass or microlithiasis
visualized.

Right epididymis:  Normal in size and appearance.

Left epididymis:  Normal in size and appearance.

Hydrocele:  Small bilateral hydroceles.

Varicocele:  None visualized.

Pulsed Doppler interrogation of both testes demonstrates normal low
resistance arterial and venous waveforms bilaterally.
IMPRESSION: Small bilateral hydroceles, otherwise unremarkable scrotal
ultrasound.

## 2019-03-02 ENCOUNTER — Other Ambulatory Visit: Payer: Self-pay

## 2019-03-02 ENCOUNTER — Emergency Department
Admission: EM | Admit: 2019-03-02 | Discharge: 2019-03-02 | Disposition: A | Discharge status: Home / Self Care | Payer: Managed Care, Other (non HMO) | Attending: Student | Admitting: Student

## 2019-03-02 DIAGNOSIS — Z79899 Other long term (current) drug therapy: Secondary | ICD-10-CM | POA: Diagnosis not present

## 2019-03-02 DIAGNOSIS — T7840XA Allergy, unspecified, initial encounter: Secondary | ICD-10-CM | POA: Insufficient documentation

## 2019-03-02 DIAGNOSIS — E119 Type 2 diabetes mellitus without complications: Secondary | ICD-10-CM | POA: Insufficient documentation

## 2019-03-02 DIAGNOSIS — R600 Localized edema: Secondary | ICD-10-CM | POA: Diagnosis present

## 2019-03-02 MED ORDER — FAMOTIDINE 20 MG PO TABS
40.0000 mg | ORAL_TABLET | Freq: Once | ORAL | Status: AC
  Administered 2019-03-02: 40 mg via ORAL
  Filled 2019-03-02: qty 2

## 2019-03-02 MED ORDER — PREDNISONE 20 MG PO TABS: 40.0000 mg | ORAL_TABLET | Freq: Every day | ORAL | 0 refills | Status: AC

## 2019-03-02 MED ORDER — PREDNISONE 20 MG PO TABS
40.0000 mg | ORAL_TABLET | Freq: Once | ORAL | Status: AC
  Administered 2019-03-02: 40 mg via ORAL
  Filled 2019-03-02: qty 2

## 2019-03-02 MED ORDER — DIPHENHYDRAMINE HCL 25 MG PO CAPS
ORAL_CAPSULE | ORAL | Status: AC
  Administered 2019-03-02: 50 mg
  Filled 2019-03-02: qty 2

## 2019-03-02 NOTE — ED Notes (Signed)
Swelling noted in pt's eyes bilaterally, worse in the left eye. Pt states he's unsure of the allergen- says he ate chicken sandwich and snack bars and used lotion prior to reaction. Pt states he feels swelling decreasing after PO benadryl.

## 2019-03-02 NOTE — Discharge Instructions (Signed)
Thank you for letting us take care of you in the emergency department today.   Please continue to take any regular, prescribed medications.   New medications we have prescribed:  - prednisone - steroids to help prevent a rebound allergic reaction  Please follow up with: - Your primary care doctor to review your ER visit and follow up on your symptoms. Ask for a referral to an allergy doctor for further testing.   Please return to the ER for any new or worsening symptoms, including worsening swelling, difficulty breathing, vomiting, or any other signs/symptoms that are concerning to you.

## 2019-03-02 NOTE — ED Triage Notes (Signed)
Pt reports following dinner at approx 1900 and lotion applied to hands at approx 1700 his face began to swell mainly around eyes, and he had trouble breathing through nose. Pt reports abdominal pain that has since subsided. Pt dinner was airfried chicken sandwich and snackbars. Pt is NAD at this time. Pt reports no history of allergic reaction to the foods he consumed. Pt reports lotion he used is new

## 2019-03-02 NOTE — ED Notes (Signed)
Periorbital swelling is markedly decreased from first assessment.

## 2019-03-02 NOTE — ED Notes (Signed)
Report given to Vanessa, RN.

## 2019-03-02 NOTE — ED Provider Notes (Signed)
[facility] Emergency Department Provider Note  ____________________________________________   First MD Initiated Contact with Patient 03/02/19 2040     (approximate)  I have reviewed the triage vital signs and the nursing notes.  History  Chief Complaint Allergic Reaction    HPI Darin James is a 58 y.o. male who presents for allergic reaction. Patient states he first noticed his eyes swelling around 6 PM. Left slightly greater than right. He had some trouble breathing through his nose but denies any lip, tongue, uvula swelling. No voice changes. No wheezing or chest tightness. No vomiting or diarrhea.   He denies ingesting any new foods.   He does state he used a new hand lotion this evening and may have touched his face after applying.   Given 50 mg Benadryl in triage.   Past Medical Hx Past Medical History:  Diagnosis Date  . Diabetes mellitus without complication ([place])     Problem List There are no problems to display for this patient.   Past Surgical Hx Past Surgical History:  Procedure Laterality Date  . CERVICAL SPINE SURGERY      Medications Prior to Admission medications   Medication Sig Start Date End Date Taking? Authorizing Provider  docusate sodium (COLACE) 100 MG capsule Take 2 capsules (200 mg total) by mouth 2 (two) times daily. 08/08/14   Carrie Mew, MD  ketorolac (TORADOL) 10 MG tablet Take 1 tablet (10 mg total) by mouth every 8 (eight) hours as needed for moderate pain (with food). 12/24/16   Eula Listen, MD  ondansetron (ZOFRAN ODT) 4 MG disintegrating tablet Take 1 tablet (4 mg total) by mouth every 8 (eight) hours as needed for nausea or vomiting. 12/24/16   Eula Listen, MD  senna (SENOKOT) 8.6 MG TABS tablet Take 2 tablets (17.2 mg total) by mouth 2 (two) times daily. 08/08/14   Carrie Mew, MD    Allergies Patient has no known allergies.  Family Hx History reviewed. No pertinent  family history.  Social Hx Social History   Tobacco Use  . Smoking status: Never Smoker  . Smokeless tobacco: Never Used  Substance Use Topics  . Alcohol use: No    Comment: occasionaly  . Drug use: No     Review of Systems  Constitutional: Negative for fever, chills. Eyes: Negative for visual changes. + periorbital swelling ENT: Negative for sore throat. Cardiovascular: Negative for chest pain. Respiratory: Negative for shortness of breath. Gastrointestinal: Negative for nausea, vomiting.  Genitourinary: Negative for dysuria. Musculoskeletal: Negative for leg swelling. Skin: Negative for rash. Neurological: Negative for for headaches.   Physical Exam  Vital Signs: ED Triage Vitals  Enc Vitals Group     BP 03/02/19 2026 (!) 158/93     Pulse Rate 03/02/19 2026 93     Resp 03/02/19 2026 16     Temp 03/02/19 2026 98.6 F (37 C)     Temp Source 03/02/19 2026 Oral     SpO2 03/02/19 2026 98 %     Weight 03/02/19 2027 240 lb (108.9 kg)     Height 03/02/19 2027 6\' 1"  (1.854 m)     Head Circumference --      Peak Flow --      Pain Score 03/02/19 2027 0     Pain Loc --      Pain Edu? --      Excl. in [place]? --     Constitutional: Alert and oriented.  Head: Normocephalic. Atraumatic. Eyes: Conjunctivae clear.  Sclera anicteric. Bilateral periorbital swelling (edema), L > R. No erythema. No proptosis. PERRL. EOMI without discomfort.  Nose: No congestion. No rhinorrhea. Mouth/Throat: Wearing mask. No lip/tongue/uvula swelling Neck: No stridor.  No voice changes. Cardiovascular: Normal rate, regular rhythm. Extremities well perfused. Respiratory: Normal respiratory effort.  Lungs CTAB. No wheezing.  Gastrointestinal: Soft. Non-tender. Non-distended.  Musculoskeletal: No lower extremity edema. No deformities. Neurologic:  Normal speech and language. No gross focal neurologic deficits are appreciated.  Skin: Skin is warm, dry and intact. No rash noted. Psychiatric: Mood and  affect are appropriate for situation.  Procedures  Procedure(s) performed (including critical care):  Procedures   Initial Impression / Assessment and Plan / ED Course  58 y.o. male who presents to the ED for allergic reaction. Swelling to bilateral eyes. No other symptoms. No evidence of anaphlaxysis. No evidence of infection.   Will give PO benadryl, famotidine, prednisone and monitor.   Patient observed in ED with improvement in symptoms, no development of respiratory or GI involvement. As such, will plan for discharge with short course prednisone. Advised PCP follow up. Given return precautions.    Final Clinical Impression(s) / ED Diagnosis  Final diagnoses:  Allergic reaction, initial encounter       Note:  This document was prepared using Dragon voice recognition software and may include unintentional dictation errors.   Miguel Aschoff., MD 03/03/19 [phone]

## 2019-07-15 ENCOUNTER — Other Ambulatory Visit: Payer: Self-pay | Admitting: Ophthalmology

## 2019-07-15 DIAGNOSIS — H3582 Retinal ischemia: Secondary | ICD-10-CM

## 2019-07-21 ENCOUNTER — Ambulatory Visit: Payer: BC Managed Care – PPO

## 2019-08-11 ENCOUNTER — Ambulatory Visit: Payer: BC Managed Care – PPO
